# Patient Record
Sex: Male | Born: 1970 | Hispanic: Yes | Marital: Married | Smoking: Never smoker
Health system: Southern US, Community
[De-identification: ages and names within clinical notes are randomized; demographics above are authoritative.]

---

## 2016-07-07 ENCOUNTER — Ambulatory Visit: Payer: Self-pay | Admitting: Podiatry

## 2016-07-20 ENCOUNTER — Ambulatory Visit (INDEPENDENT_AMBULATORY_CARE_PROVIDER_SITE_OTHER): Payer: BLUE CROSS/BLUE SHIELD | Admitting: Orthopedic Surgery

## 2016-07-20 DIAGNOSIS — S93421A Sprain of deltoid ligament of right ankle, initial encounter: Secondary | ICD-10-CM | POA: Diagnosis not present

## 2016-07-20 DIAGNOSIS — S93411A Sprain of calcaneofibular ligament of right ankle, initial encounter: Secondary | ICD-10-CM

## 2016-08-10 ENCOUNTER — Encounter (INDEPENDENT_AMBULATORY_CARE_PROVIDER_SITE_OTHER): Payer: Self-pay | Admitting: Orthopedic Surgery

## 2016-08-10 ENCOUNTER — Ambulatory Visit (INDEPENDENT_AMBULATORY_CARE_PROVIDER_SITE_OTHER): Payer: BLUE CROSS/BLUE SHIELD | Admitting: Orthopedic Surgery

## 2016-08-10 VITALS — Ht 63.0 in | Wt 150.0 lb

## 2016-08-10 DIAGNOSIS — M7751 Other enthesopathy of right foot: Secondary | ICD-10-CM | POA: Diagnosis not present

## 2016-08-10 DIAGNOSIS — M25871 Other specified joint disorders, right ankle and foot: Secondary | ICD-10-CM | POA: Insufficient documentation

## 2016-08-10 NOTE — Progress Notes (Signed)
   Office Visit Note   Patient: Elijah Newton           Date of Birth: 1970-12-01           MRN: [id number] Visit Date: 08/10/2016              Requested by: Carolan ClinesGina M Snider, PA [address] [place], [place] [phone] PCP: Carolan ClinesSNIDER, GINA M, PA   Assessment & Plan: Visit Diagnoses: Impingement syndrome right ankle Plan: Plan for right ankle arthroscopy. Discussed the patient should obtain approximate 75% improvement in the right ankle symptoms. Discussed the risks and benefits including infection neurovascular injury persistent pain and need for additional surgery. Patient states he understands and wishes to proceed at this time.  Follow-Up Instructions: Return in about 2 weeks (around 08/24/2016).   Orders:  No orders of the defined types were placed in this encounter.  No orders of the defined types were placed in this encounter.     Procedures: No procedures performed   Clinical Data: No additional findings.   Subjective: Chief Complaint  Patient presents with  . Right Ankle - Follow-up    Impingement of right ankle s/p ankle sprain DOI roughly 6 months ago.    DOI roughly 6 months ago per pt report at last visit. S/pinjection of right ankle on 07/20/16 for ankle impingement. Pt states that the injection worked but pain is coming back over the past 5 days. He states that it does not hurt as badly as it did before. He is not taking anything for pain. In regular shoes wear and full wtb.    Review of Systems   Objective: Vital Signs: Ht 5\' 3"  (1.6 m)   Wt 150 lb (68 kg)   BMI 26.57 kg/m   Physical Exam on examination patient is alert oriented no adenopathy well-dressed normal affect normal respiratory effort he has a normal gait. Examination of right ankle there is no redness no cellulitis no swelling no signs of infection. Patient is tender to palpation anteriorly. Patient had good interval relief after the initial injection but this only lasted several  weeks. Ortho Exam trial  Specialty Comments:  No specialty comments available.  Imaging: No results found.   PMFS History: Patient Active Problem List   Diagnosis Date Noted  . Impingement syndrome of right ankle 08/10/2016   History reviewed. No pertinent past medical history.  History reviewed. No pertinent family history.  History reviewed. No pertinent surgical history. Social History   Occupational History  . Not on file.   Social History Main Topics  . Smoking status: Never Smoker  . Smokeless tobacco: Never Used  . Alcohol use No  . Drug use: No  . Sexual activity: Not on file

## 2016-09-01 DIAGNOSIS — M19171 Post-traumatic osteoarthritis, right ankle and foot: Secondary | ICD-10-CM | POA: Diagnosis not present

## 2016-09-14 ENCOUNTER — Encounter (INDEPENDENT_AMBULATORY_CARE_PROVIDER_SITE_OTHER): Payer: Self-pay | Admitting: Orthopedic Surgery

## 2016-09-14 ENCOUNTER — Ambulatory Visit (INDEPENDENT_AMBULATORY_CARE_PROVIDER_SITE_OTHER): Payer: BLUE CROSS/BLUE SHIELD | Admitting: Orthopedic Surgery

## 2016-09-14 DIAGNOSIS — M7751 Other enthesopathy of right foot: Secondary | ICD-10-CM

## 2016-09-14 DIAGNOSIS — M25871 Other specified joint disorders, right ankle and foot: Secondary | ICD-10-CM

## 2016-09-14 NOTE — Progress Notes (Signed)
   Office Visit Note   Patient: Elijah Newton           Date of Birth: 04/17/1971           MRN: [id number] Visit Date: 09/14/2016 Requested by: Carolan ClinesGina M Snider, PA [address] [place], [place] [phone] PCP: Carolan ClinesSNIDER, GINA M, PA  Subjective: Chief Complaint  Patient presents with  . Right Ankle - Pain    Patient is here in followup for right ankle pain.  He is doing his home exercises, and feels a little different kind of pain recently. He says he is doing well overall.  He is not taking any medications for pain.                Review of Systems  Constitutional: Negative for chills and fever.     Assessment & Plan: Visit Diagnoses:  1. Impingement syndrome of right ankle     Plan: Advance weightbearing as tolerated. No restrictions. Sutures were harvested today.  Follow-Up Instructions: Return in about 4 weeks (around 10/12/2016).   Orders:  No orders of the defined types were placed in this encounter.  No orders of the defined types were placed in this encounter.     Procedures: No procedures performed   Clinical Data: No additional findings.  Objective: Vital Signs: There were no vitals taken for this visit.  Physical Exam  Right Ankle Exam  Right ankle exam is normal. Swelling: mild  Tenderness  The patient is experiencing no tenderness.  Range of Motion  Inversion: normal  Eversion: normal  Other  Scars: present Pulse: present   Comments:  Sutures in place. Portals clean and dry.       Specialty Comments:  No specialty comments available.  Imaging: No results found.   PMFS History: Patient Active Problem List   Diagnosis Date Noted  . Impingement syndrome of right ankle 08/10/2016   No past medical history on file.  No family history on file.  No past surgical history on file. Social History   Occupational History  . Not on file.   Social History Main Topics  . Smoking status: Never Smoker  . Smokeless tobacco:  Never Used  . Alcohol use No  . Drug use: No  . Sexual activity: Not on file

## 2016-10-08 ENCOUNTER — Ambulatory Visit (INDEPENDENT_AMBULATORY_CARE_PROVIDER_SITE_OTHER): Payer: BLUE CROSS/BLUE SHIELD | Admitting: Orthopedic Surgery

## 2016-10-19 ENCOUNTER — Ambulatory Visit (INDEPENDENT_AMBULATORY_CARE_PROVIDER_SITE_OTHER): Payer: BLUE CROSS/BLUE SHIELD | Admitting: Orthopedic Surgery

## 2016-10-29 ENCOUNTER — Ambulatory Visit (INDEPENDENT_AMBULATORY_CARE_PROVIDER_SITE_OTHER): Payer: BLUE CROSS/BLUE SHIELD | Admitting: Orthopedic Surgery

## 2016-12-03 ENCOUNTER — Encounter (INDEPENDENT_AMBULATORY_CARE_PROVIDER_SITE_OTHER): Payer: Self-pay | Admitting: Orthopedic Surgery

## 2016-12-03 ENCOUNTER — Ambulatory Visit (INDEPENDENT_AMBULATORY_CARE_PROVIDER_SITE_OTHER): Payer: BLUE CROSS/BLUE SHIELD | Admitting: Orthopedic Surgery

## 2016-12-03 VITALS — Ht 63.0 in | Wt 150.0 lb

## 2016-12-03 DIAGNOSIS — M7751 Other enthesopathy of right foot: Secondary | ICD-10-CM

## 2016-12-03 DIAGNOSIS — M25871 Other specified joint disorders, right ankle and foot: Secondary | ICD-10-CM

## 2016-12-03 NOTE — Progress Notes (Signed)
   Office Visit Note   Patient: Elijah Newton           Date of Birth: [birth year]           MRN: [id number] Visit Date: 12/03/2016              Requested by: Elijah ClinesGina M Snider, Newton [address] [place], [place] [phone] PCP: Elijah ClinesSNIDER, GINA M, Newton  Chief Complaint  Patient presents with  . Right Ankle - Pain    HPI: Three week follow up for right ankle impingement s/p ankle scope the end of November. He states that he is having pain front of the ankle and complains of a lump at the incision portal. He states that the pain comes and goes and sometimes involves the lateral side of the ankle. He is in regular shoe wear. Elijah Newton, RMA  Patient states he does have some start up stiffness in the morning but overall he is about 90% better.  Assessment & Plan: Visit Diagnoses:  1. Impingement syndrome of right ankle     Plan: Recommended continue range of motion strengthening and scar massage.  Follow-Up Instructions: Return if symptoms worsen or fail to improve.   Ortho Exam Examination there is no redness no swelling he has pain-free crepitation free range of motion of the right ankle. He does have a little bit of scar tissue over the lateral portal.  Imaging: No results found.  Orders:  No orders of the defined types were placed in this encounter.  No orders of the defined types were placed in this encounter.    Procedures: No procedures performed  Clinical Data: No additional findings.  Subjective: Review of Systems  Objective: Vital Signs: Ht 5\' 3"  (1.6 m)   Wt 150 lb (68 kg)   BMI 26.57 kg/m   Specialty Comments:  No specialty comments available.  PMFS History: Patient Active Problem List   Diagnosis Date Noted  . Impingement syndrome of right ankle 08/10/2016   No past medical history on file.  No family history on file.  No past surgical history on file. Social History   Occupational History  . Not on file.   Social History Main  Topics  . Smoking status: Never Smoker  . Smokeless tobacco: Never Used  . Alcohol use No  . Drug use: No  . Sexual activity: Not on file

## 2017-11-22 ENCOUNTER — Ambulatory Visit (INDEPENDENT_AMBULATORY_CARE_PROVIDER_SITE_OTHER): Payer: BLUE CROSS/BLUE SHIELD | Admitting: Orthopedic Surgery

## 2017-12-01 ENCOUNTER — Encounter (INDEPENDENT_AMBULATORY_CARE_PROVIDER_SITE_OTHER): Payer: Self-pay | Admitting: Orthopedic Surgery

## 2017-12-01 ENCOUNTER — Ambulatory Visit (INDEPENDENT_AMBULATORY_CARE_PROVIDER_SITE_OTHER): Payer: BLUE CROSS/BLUE SHIELD | Admitting: Orthopedic Surgery

## 2017-12-01 DIAGNOSIS — M76821 Posterior tibial tendinitis, right leg: Secondary | ICD-10-CM | POA: Diagnosis not present

## 2017-12-01 NOTE — Progress Notes (Signed)
   Office Visit Note   Patient: Elijah Newton           Date of Birth: 11-05-1970           MRN: [id number] Visit Date: 02/20/2Charyl Bigger019              Requested by: Carolan ClinesSnider, Gina M, PA [address] [place], [place] [phone] PCP: Carolan ClinesSnider, Gina M, [place]  Chief Complaint  Patient presents with  . Right Ankle - Edema, Pain      HPI: Patient is a 47 year old gentleman who is status post right ankle arthroscopy for impingement in 2017.  Patient states he started having pain in the posterior medial aspect of his right ankle.  He complains of swelling and pain.  He states he does elevate his legs at night.  Assessment & Plan: Visit Diagnoses:  1. Posterior tibial tendinitis, right leg     Plan: We will place 2 felt relieving pads to better support his arch.  At follow-up in 4 weeks he will bring his sneakers and his over-the-counter orthotics we will see if we can modify those.  Follow-Up Instructions: Return in about 4 weeks (around 12/29/2017).   Ortho Exam  Patient is alert, oriented, no adenopathy, well-dressed, normal affect, normal respiratory effort. Examination patient has normal gait he has good pulses good ankle good subtalar motion.  The ankle joint is essentially nontender to palpation the sinus Tarsi is nontender to palpation.  With standing he has increasing pronation and valgus of the right foot compared to the left.  With single limb heel raise patient reproduces the pain on the right side  and has no heel varus with single limb heel raise.  He has good heel varus with single limb heel raise and is asymptomatic on the left side.  The posterior tibial tendon is tender to palpation and does have some swelling.  Imaging: No results found. No images are attached to the encounter.  Labs: No results found for: HGBA1C, ESRSEDRATE, CRP, LABURIC, REPTSTATUS, GRAMSTAIN, CULT, LABORGA  @LABSALLVALUES (HGBA1)@  There is no height or weight on file to calculate BMI.  Orders:    No orders of the defined types were placed in this encounter.  No orders of the defined types were placed in this encounter.    Procedures: No procedures performed  Clinical Data: No additional findings.  ROS:  All other systems negative, except as noted in the HPI. Review of Systems  Objective: Vital Signs: There were no vitals taken for this visit.  Specialty Comments:  No specialty comments available.  PMFS History: Patient Active Problem List   Diagnosis Date Noted  . Posterior tibial tendinitis, right leg 12/01/2017  . Impingement syndrome of right ankle 08/10/2016   History reviewed. No pertinent past medical history.  History reviewed. No pertinent family history.  History reviewed. No pertinent surgical history. Social History   Occupational History  . Not on file  Tobacco Use  . Smoking status: Never Smoker  . Smokeless tobacco: Never Used  Substance and Sexual Activity  . Alcohol use: No  . Drug use: No  . Sexual activity: Not on file

## 2017-12-03 ENCOUNTER — Telehealth (INDEPENDENT_AMBULATORY_CARE_PROVIDER_SITE_OTHER): Payer: Self-pay | Admitting: Orthopedic Surgery

## 2017-12-03 NOTE — Telephone Encounter (Signed)
Please advise 

## 2017-12-03 NOTE — Telephone Encounter (Signed)
Patient called to state that the pods(things inserted in shoe)made feet feel better. Wants to know if he needs a RX to get another pair.  Please call patient to advise

## 2017-12-06 NOTE — Telephone Encounter (Signed)
Called and sw pt to advise of message below will call with any other questions.

## 2017-12-06 NOTE — Telephone Encounter (Signed)
He should be able to obtain the scaphoid pads from [employer] at a medical supply store such as [facility] or [facility].

## 2017-12-29 ENCOUNTER — Ambulatory Visit (INDEPENDENT_AMBULATORY_CARE_PROVIDER_SITE_OTHER): Payer: BLUE CROSS/BLUE SHIELD | Admitting: Orthopedic Surgery

## 2017-12-29 ENCOUNTER — Encounter (INDEPENDENT_AMBULATORY_CARE_PROVIDER_SITE_OTHER): Payer: Self-pay | Admitting: Orthopedic Surgery

## 2017-12-29 VITALS — Ht 63.0 in | Wt 150.0 lb

## 2017-12-29 DIAGNOSIS — M76821 Posterior tibial tendinitis, right leg: Secondary | ICD-10-CM

## 2017-12-29 NOTE — Progress Notes (Signed)
   Office Visit Note   Patient: Elijah Newton           Date of Birth: 06/11/1971           MRN: [id number] Visit Date: 12/29/2017              Requested by: Carolan ClinesSnider, Gina M, PA [address] [place], [place] [phone] PCP: Carolan ClinesSnider, Gina M, [place]  Chief Complaint  Patient presents with  . 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  Patient states that he still has pain over the posterior tibial tendon despite using the felt pads.  He states he has increased pain in the morning with start up and states that he is concerned with the swelling and the discoloration around the ankle from the venous insufficiency.  Assessment & Plan: Visit Diagnoses:  1. Posterior tibial tendinitis, right leg     Plan: Recommended 15-20 mm compression knee-high stockings we will place him in a posterior tibial tendon brace.  Follow-Up Instructions: Return in about 4 weeks (around 01/26/2018).   Ortho Exam  Patient is alert, oriented, no adenopathy, well-dressed, normal affect, normal respiratory effort. Examination patient has good pulses he has good ankle good subtalar motion he has slight increased valgus of the hindfoot with pronation of the forefoot.  He is tender to palpation directly over the posterior tibial tendon as well as at the insertion.  The ankle joint is not tender to palpation.  He has a normal gait.  Imaging: No results found. No images are attached to the encounter.  Labs: No results found for: HGBA1C, ESRSEDRATE, CRP, LABURIC, REPTSTATUS, GRAMSTAIN, CULT, LABORGA  @LABSALLVALUES (HGBA1)@  Body mass index is 26.57 kg/m.  Orders:  No orders of the defined types were placed in this encounter.  No orders of the defined types were placed in this encounter.    Procedures: No procedures performed  Clinical Data: No additional findings.  ROS:  All other systems negative, except as  noted in the HPI. Review of Systems  Objective: Vital Signs: Ht 5\' 3"  (1.6 m)   Wt 150 lb (68 kg)   BMI 26.57 kg/m   Specialty Comments:  No specialty comments available.  PMFS History: Patient Active Problem List   Diagnosis Date Noted  . Posterior tibial tendinitis, right leg 12/01/2017  . Impingement syndrome of right ankle 08/10/2016   History reviewed. No pertinent past medical history.  History reviewed. No pertinent family history.  History reviewed. No pertinent surgical history. Social History   Occupational History  . Not on file  Tobacco Use  . Smoking status: Never Smoker  . Smokeless tobacco: Never Used  Substance and Sexual Activity  . Alcohol use: No  . Drug use: No  . Sexual activity: Not on file

## 2018-01-26 ENCOUNTER — Encounter (INDEPENDENT_AMBULATORY_CARE_PROVIDER_SITE_OTHER): Payer: Self-pay | Admitting: Orthopedic Surgery

## 2018-01-26 ENCOUNTER — Ambulatory Visit (INDEPENDENT_AMBULATORY_CARE_PROVIDER_SITE_OTHER): Payer: BLUE CROSS/BLUE SHIELD | Admitting: Orthopedic Surgery

## 2018-01-26 DIAGNOSIS — M6701 Short Achilles tendon (acquired), right ankle: Secondary | ICD-10-CM | POA: Diagnosis not present

## 2018-01-26 DIAGNOSIS — M76821 Posterior tibial tendinitis, right leg: Secondary | ICD-10-CM | POA: Diagnosis not present

## 2018-01-26 NOTE — Progress Notes (Signed)
   Office Visit Note   Patient: Elijah Newton           Date of Birth: 27-Jun-1971           MRN: [id number] Visit Date: 01/26/2018              Requested by: Carolan ClinesSnider, Gina M, PA [address] [place], [place] [phone] PCP: Carolan ClinesSnider, Gina M, [place]  No chief complaint on file.     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  Patient states he now has some numbness over the fifth metatarsal head.  He states the brace has helped a lot but has to wear the brace constantly to get relief.  Assessment & Plan: Visit Diagnoses:  1. Posterior tibial tendinitis, right leg   2. Achilles tendon contracture, right     Plan: Recommended Achilles stretching to unload pressure from the fifth metatarsal head.  Recommended a stiff soled orthotic such as a sole orthotic to see if this would provide sufficient relief without wearing the ASO brace.  Follow-Up Instructions: Return if symptoms worsen or fail to improve.   Ortho Exam  Patient is alert, oriented, no adenopathy, well-dressed, normal affect, normal respiratory effort. Examination patient has good pulses he has good ankle good subtalar motion he does have Achilles contracture with dorsiflexion only to neutral.  He is tender to palpation along the posterior tibial tendon beneath the medial malleolus there is no tenderness at the insertion.  He has good inversion strength he has numbness to palpation over the fifth metatarsal head.  He has dorsiflexion only to neutral with his knee extended.  Imaging: No results found. No images are attached to the encounter.  Labs: No results found for: HGBA1C, ESRSEDRATE, CRP, LABURIC, REPTSTATUS, GRAMSTAIN, CULT, LABORGA  @LABSALLVALUES (HGBA1)@  There is no height or weight on file to calculate BMI.  Orders:  No orders of the defined types were placed in this encounter.  No orders of the defined types were placed in this encounter.    Procedures: No procedures performed  Clinical Data: No additional findings.  ROS:  All other systems negative, except as noted in the HPI. Review of Systems  Objective: Vital Signs: There were no vitals taken for this visit.  Specialty Comments:  No specialty comments available.  PMFS History: Patient Active Problem List   Diagnosis Date Noted  . 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 Posterior tibial tendinitis, right leg 12/01/2017  . Impingement syndrome of right ankle 08/10/2016   History reviewed. No pertinent past medical history.  History reviewed. No pertinent family history.  History reviewed. No pertinent surgical history. Social History   Occupational History  . Not on file  Tobacco Use  . Smoking status: Never Smoker  . Smokeless tobacco: Never Used  Substance and Sexual Activity  . Alcohol use: No  . Drug use: No  . Sexual activity: Not on file

## 2021-01-07 ENCOUNTER — Ambulatory Visit (INDEPENDENT_AMBULATORY_CARE_PROVIDER_SITE_OTHER): Payer: BC Managed Care – PPO | Admitting: Orthopedic Surgery

## 2021-01-07 ENCOUNTER — Encounter: Payer: Self-pay | Admitting: Orthopedic Surgery

## 2021-01-07 ENCOUNTER — Ambulatory Visit: Payer: Self-pay

## 2021-01-07 DIAGNOSIS — M79671 Pain in right foot: Secondary | ICD-10-CM

## 2021-01-07 DIAGNOSIS — M5416 Radiculopathy, lumbar region: Secondary | ICD-10-CM | POA: Diagnosis not present

## 2021-01-07 DIAGNOSIS — M6701 Short Achilles tendon (acquired), right ankle: Secondary | ICD-10-CM

## 2021-01-07 MED ORDER — PREDNISONE 10 MG PO TABS: 10.0000 mg | ORAL_TABLET | Freq: Every day | ORAL | 0 refills | Status: DC

## 2021-01-07 NOTE — Progress Notes (Signed)
Office Visit Note   Patient: Elijah Newton           Date of Birth: [birth year]           MRN: [id number] Visit Date: 01/07/2021              Requested by: Carolan Clines, PA [address] [place],  [place] [address] PCP: Carolan Clines, [place]  Chief Complaint  Patient presents with  . Right Foot - Pain      HPI: Patient is a 50 year old gentleman who presents for 2 separate issues.  Patient was being treated for plantar fasciitis and posterior tibial tendinitis on the right he was wearing arch supports and he feels like this aggravated his back he now has back pain that radiates to the left buttocks and occasional radicular pain in the left posterior calf.  Patient complains of start up pain in the morning with plantar fascial pain on the right as well as start of pain after prolonged sitting.  Assessment & Plan: Visit Diagnoses:  1. Pain in right foot   2. Achilles tendon contracture, right   3. Radiculopathy, lumbar region     Plan: Patient will start with a low-dose prednisone 10 mg with breakfast continue with Achilles stretching on the right reevaluate in 4 weeks.  If patient's radicular symptoms still persist we may need to order an MRI scan of the lumbar spine.  Follow-Up Instructions: Return in about 4 weeks (around 02/04/2021).   Ortho Exam  Patient is alert, oriented, no adenopathy, well-dressed, normal affect, normal respiratory effort. Examination patient is point tender to palpation of the origin of the plantar fascia of the right he has dorsiflexion of the ankle about 20 degrees past neutral.  He has a negative straight leg raise on the right.  On the left patient has no pain with range of motion of the hip knee or ankle he has a positive straight leg raise on the left and no focal motor weakness in the left lower extremity.  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  Imaging: No results found. No images are attached to the encounter.  Labs: No results found for: HGBA1C, ESRSEDRATE, CRP, LABURIC, REPTSTATUS, GRAMSTAIN, CULT, LABORGA   No results found for: ALBUMIN, PREALBUMIN, CBC  No results found for: MG No results found for: VD25OH  No results found for: PREALBUMIN No flowsheet data found.   There is no height or weight on file to calculate BMI.  Orders:  Orders Placed This Encounter  Procedures  . XR Foot 2 Views Right   Meds ordered this encounter  Medications  . predniSONE (DELTASONE) 10 MG tablet    Sig: Take 1 tablet (10 mg total) by mouth daily with breakfast.    Dispense:  30 tablet    Refill:  0     Procedures: No procedures performed  Clinical Data: No additional findings.  ROS:  All other systems negative, except as noted in the HPI. Review of Systems  Objective: Vital Signs: There were no vitals taken for this visit.  Specialty Comments:  No specialty comments available.  PMFS History: Patient Active Problem List   Diagnosis Date Noted  . 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 Posterior tibial tendinitis, right leg 12/01/2017  . Impingement syndrome of right ankle 08/10/2016   History reviewed. No pertinent past medical history.  History reviewed. No pertinent family history.  History reviewed. No pertinent surgical history. Social History   Occupational History  . Not on file  Tobacco Use  . Smoking status: Never Smoker  . Smokeless tobacco: Never Used  Substance and Sexual Activity  . Alcohol use: No  . Drug use: No  . Sexual activity: Not on file

## 2021-02-05 ENCOUNTER — Encounter: Payer: Self-pay | Admitting: Physician Assistant

## 2021-02-05 ENCOUNTER — Ambulatory Visit (INDEPENDENT_AMBULATORY_CARE_PROVIDER_SITE_OTHER): Payer: BC Managed Care – PPO | Admitting: Physician Assistant

## 2021-02-05 DIAGNOSIS — M5416 Radiculopathy, lumbar region: Secondary | ICD-10-CM

## 2021-02-05 MED ORDER — PREDNISONE 10 MG PO TABS: 10.0000 mg | ORAL_TABLET | Freq: Every day | ORAL | 0 refills | Status: DC

## 2021-02-05 NOTE — Progress Notes (Signed)
Office Visit Note   Patient: Elijah Newton           Date of Birth: 12-16-1970           MRN: [id number] Visit Date: 02/05/2021              Requested by: Carolan Clines, PA [address] [place],  [place] [address] PCP: Carolan Clines, [place]  No chief complaint on file.     HPI: Patient is a pleasant 50 year old gentleman who follows up today for his lower back pain.  He was seen by Dr. Lajoyce Corners about a month ago for follow-up on his posterior tibial tendinitis.  He had been using arch supports that he felt were triggering some lower back and radicular symptoms.  Dr. Lajoyce Corners placed him on a trial of prednisone.  He said the prednisone has helped a little bit but in some ways he thinks some of his symptoms has progressed.  Originally he only had pain on the left side radiating down his leg now he is beginning to get similar symptoms on the right from the right buttock radiating down to the right knee.  He also feels like the left-sided pain is now wrapping around to his groin  Assessment & Plan: Visit Diagnoses: No diagnosis found.  Plan: Patient will have an MRI of his lumbar spine.  We can review this with him by phone if needed and with referral to Dr. Alvester Morin for epidural steroid injections  Follow-Up Instructions: No follow-ups on file.   Ortho Exam  Patient is alert, oriented, no adenopathy, well-dressed, normal affect, normal respiratory effort. He has a negative straight leg raise on the right a positive straight leg raise on the wet left.  He has pain radiating down the left buttock.  He does have some mild pain with internal and external rotation of the hip but this reproduces a burning pain per his description.  Bilaterally he has good resisted plantar flexion and dorsiflexion.  No focal motor weakness  Imaging: No results found. No images are attached to the encounter.  Labs: No results found for: HGBA1C, ESRSEDRATE, CRP, LABURIC, REPTSTATUS, GRAMSTAIN, CULT,  LABORGA   No results found for: ALBUMIN, PREALBUMIN, CBC  No results found for: MG No results found for: VD25OH  No results found for: PREALBUMIN No flowsheet data found.   There is no height or weight on file to calculate BMI.  Orders:  No orders of the defined types were placed in this encounter.  No orders of the defined types were placed in this encounter.    Procedures: No procedures performed  Clinical Data: No additional findings.  ROS:  All other systems negative, except as noted in the HPI. Review of Systems  Objective: Vital Signs: There were no vitals taken for this visit.  Specialty Comments:  No specialty comments available.  PMFS History: Patient Active Problem List   Diagnosis Date Noted  . 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 Posterior tibial tendinitis, right leg 12/01/2017  . Impingement syndrome of right ankle 08/10/2016   No past medical history on file.  No family history on file.  No past surgical history on file. Social History   Occupational History  . Not on file  Tobacco Use  . Smoking status: Never Smoker  . Smokeless tobacco: Never Used  Substance and Sexual Activity  . Alcohol use: No  . Drug use: No  . Sexual activity: Not on file

## 2021-02-18 ENCOUNTER — Ambulatory Visit
Admission: RE | Admit: 2021-02-18 | Discharge: 2021-02-18 | Disposition: A | Discharge status: Home / Self Care | Payer: BC Managed Care – PPO | Source: Ambulatory Visit | Attending: Physician Assistant | Admitting: Physician Assistant

## 2021-02-18 ENCOUNTER — Other Ambulatory Visit: Payer: Self-pay

## 2021-02-18 DIAGNOSIS — M5416 Radiculopathy, lumbar region: Secondary | ICD-10-CM

## 2021-02-20 ENCOUNTER — Encounter: Payer: Self-pay | Admitting: Orthopedic Surgery

## 2021-02-20 ENCOUNTER — Ambulatory Visit (INDEPENDENT_AMBULATORY_CARE_PROVIDER_SITE_OTHER): Payer: BC Managed Care – PPO | Admitting: Orthopedic Surgery

## 2021-02-20 DIAGNOSIS — M5416 Radiculopathy, lumbar region: Secondary | ICD-10-CM | POA: Diagnosis not present

## 2021-02-20 NOTE — Progress Notes (Signed)
   Office Visit Note   Patient: Elijah Newton           Date of Birth: 12-07-1970           MRN: [id number] Visit Date: 02/20/2021              Requested by: Carolan Clines, PA [address] [place],  [place] [address] PCP: Carolan Clines, [place]  Chief Complaint  Patient presents with  . Lower Back - Follow-up    MRI review       HPI: Patient is a 50 year old gentleman who still has persistent left-sided radicular symptoms that radiates from the buttock anteriorly to the thigh and down his leg.  Patient has pain with sitting pain with prolonged standing states he has to continue moving.  Assessment & Plan: Visit Diagnoses:  1. Radiculopathy, lumbar region     Plan: We will request epidural steroid injections with Dr. Alvester Morin.  Follow-Up Instructions: Return if symptoms worsen or fail to improve, for Follow-up with Dr. Alvester Morin.   Ortho Exam  Patient is alert, oriented, no adenopathy, well-dressed, normal affect, normal respiratory effort. Examination patient has a normal gait he does have a positive straight leg raise on the left.  He has no focal motor weakness.  Review of the MRI scan shows disc bulge at the L4-5 level affecting the L5 nerve root on the left.  There is also some bulging at L3-4 with mild left foraminal narrowing.  All bulging disks on the left side consistent with his symptoms.  Imaging: No results found. No images are attached to the encounter.  Labs: No results found for: HGBA1C, ESRSEDRATE, CRP, LABURIC, REPTSTATUS, GRAMSTAIN, CULT, LABORGA   No results found for: ALBUMIN, PREALBUMIN, CBC  No results found for: MG No results found for: VD25OH  No results found for: PREALBUMIN No flowsheet data found.   There is no height or weight on file to calculate BMI.  Orders:  No orders of the defined types were placed in this encounter.  No orders of the defined types were placed in this encounter.    Procedures: No procedures  performed  Clinical Data: No additional findings.  ROS:  All other systems negative, except as noted in the HPI. Review of Systems  Objective: Vital Signs: There were no vitals taken for this visit.  Specialty Comments:  No specialty comments available.  PMFS History: Patient Active Problem List   Diagnosis Date Noted  . 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 Posterior tibial tendinitis, right leg 12/01/2017  . Impingement syndrome of right ankle 08/10/2016   History reviewed. No pertinent past medical history.  History reviewed. No pertinent family history.  History reviewed. No pertinent surgical history. Social History   Occupational History  . Not on file  Tobacco Use  . Smoking status: Never Smoker  . Smokeless tobacco: Never Used  Substance and Sexual Activity  . Alcohol use: No  . Drug use: No  . Sexual activity: Not on file

## 2021-03-05 ENCOUNTER — Telehealth: Payer: Self-pay

## 2021-03-05 ENCOUNTER — Other Ambulatory Visit: Payer: Self-pay

## 2021-03-05 DIAGNOSIS — M5416 Radiculopathy, lumbar region: Secondary | ICD-10-CM

## 2021-03-05 MED ORDER — PREDNISONE 10 MG PO TABS: 10.0000 mg | ORAL_TABLET | Freq: Every day | ORAL | 0 refills | Status: DC

## 2021-03-05 NOTE — Telephone Encounter (Signed)
I called and sw pt to advise ok per PA to refill prednisone and this was sent to pharm. Per Dr. Audrie Lia last note see that referral to [facility] for ESI was dictated but no order in chart this was entered today. I called pt to advise of all of this and voiced understanding will call with any questions.

## 2021-03-05 NOTE — Telephone Encounter (Signed)
Patient called he is requesting a rx refill for prednisone , patient is also requesting a referral to be sent to Dr.Newton for a injection in his back which was discussed in the last visit call [phone]

## 2021-03-14 ENCOUNTER — Telehealth: Payer: Self-pay | Admitting: Orthopedic Surgery

## 2021-03-14 NOTE — Telephone Encounter (Signed)
Pt called today wanting to get scheduled to be seen for injections. Chart says they have been approved and authorized in referrals with Dr. Alvester Morin on 03/11/21. The best call back number is [phone].

## 2021-03-14 NOTE — Telephone Encounter (Signed)
Scheduled

## 2021-03-14 NOTE — Telephone Encounter (Signed)
Left L5 TF- Lajoyce Corners

## 2021-03-17 ENCOUNTER — Telehealth: Payer: Self-pay | Admitting: Physical Medicine and Rehabilitation

## 2021-03-17 NOTE — Telephone Encounter (Signed)
Patient called asked if he can get the latest appointment he can? Patient said the appointment need to be latest appointment of the day. The number to contact patient is  [phone]

## 2021-03-17 NOTE — Telephone Encounter (Signed)
Patient will keep current appointment.

## 2021-03-25 ENCOUNTER — Ambulatory Visit (INDEPENDENT_AMBULATORY_CARE_PROVIDER_SITE_OTHER): Payer: BC Managed Care – PPO | Admitting: Physical Medicine and Rehabilitation

## 2021-03-25 ENCOUNTER — Encounter: Payer: Self-pay | Admitting: Physical Medicine and Rehabilitation

## 2021-03-25 ENCOUNTER — Ambulatory Visit: Payer: Self-pay

## 2021-03-25 ENCOUNTER — Other Ambulatory Visit: Payer: Self-pay

## 2021-03-25 VITALS — BP 107/76 | HR 87

## 2021-03-25 DIAGNOSIS — M5416 Radiculopathy, lumbar region: Secondary | ICD-10-CM | POA: Diagnosis not present

## 2021-03-25 MED ORDER — METHYLPREDNISOLONE ACETATE 80 MG/ML IJ SUSP
80.0000 mg | Freq: Once | INTRAMUSCULAR | Status: AC
  Administered 2021-03-25: 80 mg

## 2021-03-25 NOTE — Progress Notes (Signed)
Pt state lower back pain that travels to his left hip. Pt state walking, standing and laying down makes the pain worse. Pt state he takes pain meds to help ease his pain.  Numeric Pain Rating Scale and Functional Assessment Average Pain 6   In the last MONTH (on 0-10 scale) has pain interfered with the following?  1. General activity like being  able to carry out your everyday physical activities such as walking, climbing stairs, carrying groceries, or moving a chair?  Rating(9)   +Driver, -BT, -Dye Allergies.

## 2021-03-25 NOTE — Patient Instructions (Signed)

## 2021-03-27 NOTE — Procedures (Signed)
Lumbosacral Transforaminal Epidural Steroid Injection - Sub-Pedicular Approach with Fluoroscopic Guidance  Patient: Elijah Newton      Date of Birth: 01-23-1971 MRN: [id number] PCP: Carolan Clines, PA      Visit Date: 03/25/2021   Universal Protocol:    Date/Time: 03/25/2021  Consent Given By: the patient  Position: PRONE  Additional Comments: Vital signs were monitored before and after the procedure. Patient was prepped and draped in the usual sterile fashion. The correct patient, procedure, and site was verified.   Injection Procedure Details:   Procedure diagnoses: Lumbar radiculopathy [M54.16]    Meds Administered:  Meds ordered this encounter  Medications   methylPREDNISolone acetate (DEPO-MEDROL) injection 80 mg    Laterality: Left  Location/Site:  L5-S1  Needle:5.0 in., 22 ga.  Short bevel or Quincke spinal needle  Needle Placement: Transforaminal  Findings:    -Comments: Excellent flow of contrast along the nerve, nerve root and into the epidural space.  Procedure Details: After squaring off the end-plates to get a true AP view, the C-arm was positioned so that an oblique view of the foramen as noted above was visualized. The target area is just inferior to the "nose of the scotty dog" or sub pedicular. The soft tissues overlying this structure were infiltrated with 2-3 ml. of 1% Lidocaine without Epinephrine.  The spinal needle was inserted toward the target using a "trajectory" view along the fluoroscope beam.  Under AP and lateral visualization, the needle was advanced so it did not puncture dura and was located close the 6 O'Clock position of the pedical in AP tracterory. Biplanar projections were used to confirm position. Aspiration was confirmed to be negative for CSF and/or blood. A 1-2 ml. volume of Isovue-250 was injected and flow of contrast was noted at each level. Radiographs were obtained for documentation purposes.   After attaining the desired  flow of contrast documented above, a 0.5 to 1.0 ml test dose of 0.25% Marcaine was injected into each respective transforaminal space.  The patient was observed for 90 seconds post injection.  After no sensory deficits were reported, and normal lower extremity motor function was noted,   the above injectate was administered so that equal amounts of the injectate were placed at each foramen (level) into the transforaminal epidural space.   Additional Comments:  The patient tolerated the procedure well Dressing: 2 x 2 sterile gauze and Band-Aid    Post-procedure details: Patient was observed during the procedure. Post-procedure instructions were reviewed.  Patient left the clinic in stable condition.

## 2021-03-27 NOTE — Progress Notes (Signed)
Elijah Newton - 50 y.o. male MRN [id number]  Date of birth: 1971-02-11  Office Visit Note: Visit Date: 03/25/2021 PCP: Carolan Clines, PA Referred by: Carolan Clines, PA  Subjective: Chief Complaint  Patient presents with   Lower Back - Pain   Left Hip - Pain   HPI:  Elijah Newton is a 50 y.o. male who comes in today at the request of Dr. Aldean Baker for planned Left L5-S1 Lumbar Transforaminal epidural steroid injection with fluoroscopic guidance.  The patient has failed conservative care including home exercise, medications, time and activity modification.  This injection will be diagnostic and hopefully therapeutic.  Please see requesting physician notes for further details and justification. MRI reviewed with images and spine model.  MRI reviewed in the note below.  I did review the MRI with the patient using spine models and imaging.  He does have broad disc bulging with some foraminal involvement bilaterally but no high-grade stenosis of the foramen.  Left L5 nerve root could be impacted or irritated.  We described this at length the natural history of disc protrusions and radiculitis.  Hopefully he will do well with the injection.     ROS Otherwise per HPI.  Assessment & Plan: Visit Diagnoses:    ICD-10-CM   1. Lumbar radiculopathy  M54.16 XR C-ARM NO REPORT    Epidural Steroid injection    methylPREDNISolone acetate (DEPO-MEDROL) injection 80 mg      Plan: No additional findings.   Meds & Orders:  Meds ordered this encounter  Medications   methylPREDNISolone acetate (DEPO-MEDROL) injection 80 mg    Orders Placed This Encounter  Procedures   XR C-ARM NO REPORT   Epidural Steroid injection    Follow-up: No follow-ups on file.   Procedures: No procedures performed  Lumbosacral Transforaminal Epidural Steroid Injection - Sub-Pedicular Approach with Fluoroscopic Guidance  Patient: Elijah Newton      Date of Birth: 1971/08/17 MRN: [id number] PCP: Carolan Clines, PA      Visit Date: 03/25/2021   Universal Protocol:    Date/Time: 03/25/2021  Consent Given By: the patient  Position: PRONE  Additional Comments: Vital signs were monitored before and after the procedure. Patient was prepped and draped in the usual sterile fashion. The correct patient, procedure, and site was verified.   Injection Procedure Details:   Procedure diagnoses: Lumbar radiculopathy [M54.16]    Meds Administered:  Meds ordered this encounter  Medications   methylPREDNISolone acetate (DEPO-MEDROL) injection 80 mg    Laterality: Left  Location/Site:  L5-S1  Needle:5.0 in., 22 ga.  Short bevel or Quincke spinal needle  Needle Placement: Transforaminal  Findings:    -Comments: Excellent flow of contrast along the nerve, nerve root and into the epidural space.  Procedure Details: After squaring off the end-plates to get a true AP view, the C-arm was positioned so that an oblique view of the foramen as noted above was visualized. The target area is just inferior to the "nose of the scotty dog" or sub pedicular. The soft tissues overlying this structure were infiltrated with 2-3 ml. of 1% Lidocaine without Epinephrine.  The spinal needle was inserted toward the target using a "trajectory" view along the fluoroscope beam.  Under AP and lateral visualization, the needle was advanced so it did not puncture dura and was located close the 6 O'Clock position of the pedical in AP tracterory. Biplanar projections were used to confirm position. Aspiration was confirmed to be negative for CSF and/or  blood. A 1-2 ml. volume of Isovue-250 was injected and flow of contrast was noted at each level. Radiographs were obtained for documentation purposes.   After attaining the desired flow of contrast documented above, a 0.5 to 1.0 ml test dose of 0.25% Marcaine was injected into each respective transforaminal space.  The patient was observed for 90 seconds post injection.  After no  sensory deficits were reported, and normal lower extremity motor function was noted,   the above injectate was administered so that equal amounts of the injectate were placed at each foramen (level) into the transforaminal epidural space.   Additional Comments:  The patient tolerated the procedure well Dressing: 2 x 2 sterile gauze and Band-Aid    Post-procedure details: Patient was observed during the procedure. Post-procedure instructions were reviewed.  Patient left the clinic in stable condition.    Clinical History: MRI LUMBAR SPINE WITHOUT CONTRAST   TECHNIQUE: Multiplanar, multisequence MR imaging of the lumbar spine was performed. No intravenous contrast was administered.   COMPARISON:  None.   FINDINGS: Segmentation:  Standard.   Alignment: Straightening of lordosis. Congenital spinal canal narrowing.   Vertebrae: Normal bone marrow signal intensity. No fracture or aggressive osseous lesion.   Conus medullaris and cauda equina: Conus extends to the L1 level. Conus and cauda equina appear normal.   Disc levels: Mild multilevel desiccation.   L1-2: No significant disc bulge. Patent spinal canal and neural foramen.   L2-3: Minimal disc bulge.  Patent spinal canal and neural foramen.   L3-4: Minimal disc bulge with superimposed left foraminal protrusion. Facet degenerative spurring. Patent spinal canal and right neural foramen. Minimal to mild left neural foraminal narrowing.   L4-5: Disc bulge with superimposed bilateral foraminal protrusions. Abutment of the descending left L5 nerve root. Bilateral facet degenerative spurring. Patent spinal canal. Mild bilateral neural foraminal narrowing.   L5-S1: Minimal disc bulge and bilateral facet degenerative spurring. Patent spinal canal and neural foramen.   Paraspinal and other soft tissues: Negative.   IMPRESSION: Disc bulge with superimposed bilateral foraminal protrusions at the L4-5 level. There is  abutment of the descending left L5 nerve root with mild bilateral neural foraminal narrowing at this level.   Left L3-4 foraminal protrusion with mild left neural foraminal narrowing.   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     Electronically Signed   By: Stana Bunting M.D.   On: 02/19/2021 10:25     Objective:  VS:  HT:    WT:   BMI:     BP:107/76  HR:87bpm  TEMP: ( )  RESP:  Physical Exam Vitals and nursing note reviewed.  Constitutional:      General: He is not in acute distress.    Appearance: Normal appearance. He is not ill-appearing.  HENT:     Head: Normocephalic and atraumatic.     Right Ear: External ear normal.     Left Ear: External ear normal.     Nose: No congestion.  Eyes:     Extraocular Movements: Extraocular movements intact.  Cardiovascular:     Rate and Rhythm: Normal rate.     Pulses: Normal pulses.  Pulmonary:     Effort: Pulmonary effort is normal. No respiratory distress.  Abdominal:     General: There is no distension.     Palpations: Abdomen is soft.  Musculoskeletal:        General: No tenderness or signs of injury.     Cervical back: Neck supple.  Right lower leg: No edema.     Left lower leg: No edema.     Comments: Patient has good distal strength without clonus.  Positive slump test on the left.  Skin:    Findings: No erythema or rash.  Neurological:     General: No focal deficit present.     Mental Status: He is alert and oriented to person, place, and time.     Sensory: No sensory deficit.     Motor: No weakness or abnormal muscle tone.     Coordination: Coordination normal.  Psychiatric:        Mood and Affect: Mood normal.        Behavior: Behavior normal.     Imaging: No results found.

## 2021-05-01 ENCOUNTER — Ambulatory Visit (INDEPENDENT_AMBULATORY_CARE_PROVIDER_SITE_OTHER): Payer: BC Managed Care – PPO | Admitting: Physician Assistant

## 2021-05-01 DIAGNOSIS — M5416 Radiculopathy, lumbar region: Secondary | ICD-10-CM

## 2021-05-01 NOTE — Progress Notes (Signed)
   Office Visit Note   Patient: Elijah Newton           Date of Birth: Jul 21, 1971           MRN: [id number] Visit Date: 05/01/2021              Requested by: Carolan Clines, PA [address] [place],  [place] [address] PCP: Carolan Clines, [place]  Chief Complaint  Patient presents with   Lower Back - Pain      HPI: Patient presents today to follow-up on his lower back pain that radiates to his left and right hip left greater than right he did have 1 epidural steroid injection with Dr. Alvester Morin a few weeks ago and was told to follow-up if needed.  He said he thought he got a little relief but his back felt "odd ".  He has now had return of his symptoms  Assessment & Plan: Visit Diagnoses: No diagnosis found.  Plan: Recommended either a course of physical therapy or follow-up with Dr. Alvester Morin as was suggested.  Patient would like to follow-up with Dr. Alvester Morin  Follow-Up Instructions: No follow-ups on file.   Ortho Exam  Patient is alert, oriented, no adenopathy, well-dressed, normal affect, normal respiratory effort. No tenderness in the lower spine.  He does have some tenderness to the bilateral hips.  No tenderness with back extension flexion or side to side bending.  He has a mildly straight leg raise that is positive on the left negative on the right plantarflexion strength is 5 out of 5  Imaging: No results found. No images are attached to the encounter.  Labs: No results found for: HGBA1C, ESRSEDRATE, CRP, LABURIC, REPTSTATUS, GRAMSTAIN, CULT, LABORGA   No results found for: ALBUMIN, PREALBUMIN, CBC  No results found for: MG No results found for: VD25OH  No results found for: PREALBUMIN No flowsheet data found.   There is no height or weight on file to calculate BMI.  Orders:  No orders of the defined types were placed in this encounter.  No orders of the defined types were placed in this encounter.    Procedures: No procedures performed  Clinical  Data: No additional findings.  ROS:  All other systems negative, except as noted in the HPI. Review of Systems  Objective: Vital Signs: There were no vitals taken for this visit.  Specialty Comments:  No specialty comments available.  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  No family history on file.  No past surgical history on file. Social History   Occupational History   Not on file  Tobacco Use   Smoking status: Never   Smokeless tobacco: Never  Substance and Sexual Activity   Alcohol use: No   Drug use: No   Sexual activity: Not on file

## 2021-05-07 ENCOUNTER — Telehealth: Payer: Self-pay | Admitting: Physical Medicine and Rehabilitation

## 2021-05-07 ENCOUNTER — Telehealth: Payer: Self-pay | Admitting: Orthopedic Surgery

## 2021-05-07 DIAGNOSIS — M5416 Radiculopathy, lumbar region: Secondary | ICD-10-CM

## 2021-05-07 MED ORDER — PREDNISONE 10 MG PO TABS: 50.0000 mg | ORAL_TABLET | Freq: Every day | ORAL | 0 refills | Status: DC

## 2021-05-07 NOTE — Telephone Encounter (Signed)
Please see below and advise.

## 2021-05-07 NOTE — Telephone Encounter (Signed)
Pt called wondering if he could get something for his back pain. He states he is in a lot of pain and has been waiting for an appt with Dr.Newton and no one has called him. I just put in a note for someone to call him.   CB [phone]

## 2021-05-07 NOTE — Telephone Encounter (Signed)
Pt called and would like to get a back injection with Dr.Newton. Left L5 TF  CB [phone]

## 2021-05-07 NOTE — Telephone Encounter (Signed)
I called and advised pt of the rx and that someone will call to get scheduled with Dr. Alvester Morin

## 2021-05-07 NOTE — Addendum Note (Signed)
Addended by: Barnie Del R on: 05/07/2021 01:27 PM   Modules accepted: Orders

## 2021-05-27 ENCOUNTER — Ambulatory Visit (INDEPENDENT_AMBULATORY_CARE_PROVIDER_SITE_OTHER): Payer: BC Managed Care – PPO | Admitting: Physical Medicine and Rehabilitation

## 2021-05-27 ENCOUNTER — Ambulatory Visit: Payer: Self-pay

## 2021-05-27 ENCOUNTER — Other Ambulatory Visit: Payer: Self-pay

## 2021-05-27 ENCOUNTER — Encounter: Payer: Self-pay | Admitting: Physical Medicine and Rehabilitation

## 2021-05-27 VITALS — BP 112/75 | HR 60

## 2021-05-27 DIAGNOSIS — M5416 Radiculopathy, lumbar region: Secondary | ICD-10-CM | POA: Diagnosis not present

## 2021-05-27 MED ORDER — METHYLPREDNISOLONE ACETATE 80 MG/ML IJ SUSP
80.0000 mg | Freq: Once | INTRAMUSCULAR | Status: AC
  Administered 2021-05-27: 80 mg

## 2021-05-27 NOTE — Progress Notes (Signed)
Elijah Newton - 50 y.o. male MRN [id number]  Date of birth: 1970-11-27  Office Visit Note: Visit Date: 05/27/2021 PCP: Carolan Clines, PA Referred by: Carolan Clines, PA  Subjective: Chief Complaint  Patient presents with   Lower Back - Pain   HPI:  Elijah Newton is a 50 y.o. male who comes in today at the request of Dr. Aldean Baker for planned Left L5-S1 Lumbar Transforaminal epidural steroid injection with fluoroscopic guidance.  The patient has failed conservative care including home exercise, medications, time and activity modification.  This injection will be diagnostic and hopefully therapeutic.  Please see requesting physician notes for further details and justification. MRI reviewed with images and spine model.  MRI reviewed in the note below.     ROS Otherwise per HPI.  Assessment & Plan: Visit Diagnoses:    ICD-10-CM   1. Lumbar radiculopathy  M54.16 XR C-ARM NO REPORT    Epidural Steroid injection    methylPREDNISolone acetate (DEPO-MEDROL) injection 80 mg      Plan: No additional findings.   Meds & Orders:  Meds ordered this encounter  Medications   methylPREDNISolone acetate (DEPO-MEDROL) injection 80 mg    Orders Placed This Encounter  Procedures   XR C-ARM NO REPORT   Epidural Steroid injection    Follow-up: Return if symptoms worsen or fail to improve.   Procedures: No procedures performed  Lumbosacral Transforaminal Epidural Steroid Injection - Sub-Pedicular Approach with Fluoroscopic Guidance  Patient: Elijah Newton      Date of Birth: 1970-12-09 MRN: [id number] PCP: Carolan Clines, PA      Visit Date: 05/27/2021   Universal Protocol:    Date/Time: 05/27/2021  Consent Given By: the patient  Position: PRONE  Additional Comments: Vital signs were monitored before and after the procedure. Patient was prepped and draped in the usual sterile fashion. The correct patient, procedure, and site was verified.   Injection Procedure Details:    Procedure diagnoses: Lumbar radiculopathy [M54.16]    Meds Administered:  Meds ordered this encounter  Medications   methylPREDNISolone acetate (DEPO-MEDROL) injection 80 mg    Laterality: Left  Location/Site:  L5-S1  Needle:5.0 in., 22 ga.  Short bevel or Quincke spinal needle  Needle Placement: Transforaminal  Findings:    -Comments: Excellent flow of contrast along the nerve, nerve root and into the epidural space.  Procedure Details: After squaring off the end-plates to get a true AP view, the C-arm was positioned so that an oblique view of the foramen as noted above was visualized. The target area is just inferior to the "nose of the scotty dog" or sub pedicular. The soft tissues overlying this structure were infiltrated with 2-3 ml. of 1% Lidocaine without Epinephrine.  The spinal needle was inserted toward the target using a "trajectory" view along the fluoroscope beam.  Under AP and lateral visualization, the needle was advanced so it did not puncture dura and was located close the 6 O'Clock position of the pedical in AP tracterory. Biplanar projections were used to confirm position. Aspiration was confirmed to be negative for CSF and/or blood. A 1-2 ml. volume of Isovue-250 was injected and flow of contrast was noted at each level. Radiographs were obtained for documentation purposes.   After attaining the desired flow of contrast documented above, a 0.5 to 1.0 ml test dose of 0.25% Marcaine was injected into each respective transforaminal space.  The patient was observed for 90 seconds post injection.  After no sensory deficits were  reported, and normal lower extremity motor function was noted,   the above injectate was administered so that equal amounts of the injectate were placed at each foramen (level) into the transforaminal epidural space.   Additional Comments:  The patient tolerated the procedure well Dressing: 2 x 2 sterile gauze and Band-Aid    Post-procedure  details: Patient was observed during the procedure. Post-procedure instructions were reviewed.  Patient left the clinic in stable condition.    Clinical History: MRI LUMBAR SPINE WITHOUT CONTRAST   TECHNIQUE: Multiplanar, multisequence MR imaging of the lumbar spine was performed. No intravenous contrast was administered.   COMPARISON:  None.   FINDINGS: Segmentation:  Standard.   Alignment: Straightening of lordosis. Congenital spinal canal narrowing.   Vertebrae: Normal bone marrow signal intensity. No fracture or aggressive osseous lesion.   Conus medullaris and cauda equina: Conus extends to the L1 level. Conus and cauda equina appear normal.   Disc levels: Mild multilevel desiccation.   L1-2: No significant disc bulge. Patent spinal canal and neural foramen.   L2-3: Minimal disc bulge.  Patent spinal canal and neural foramen.   L3-4: Minimal disc bulge with superimposed left foraminal protrusion. Facet degenerative spurring. Patent spinal canal and right neural foramen. Minimal to mild left neural foraminal narrowing.   L4-5: Disc bulge with superimposed bilateral foraminal protrusions. Abutment of the descending left L5 nerve root. Bilateral facet degenerative spurring. Patent spinal canal. Mild bilateral neural foraminal narrowing.   L5-S1: Minimal disc bulge and bilateral facet degenerative spurring. Patent spinal canal and neural foramen.   Paraspinal and other soft tissues: Negative.   IMPRESSION: Disc bulge with superimposed bilateral foraminal protrusions at the L4-5 level. There is abutment of the descending left L5 nerve root with mild bilateral neural foraminal narrowing at this level.   Left L3-4 foraminal protrusion with mild left neural foraminal narrowing.   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     Electronically Signed   By: Stana Bunting M.D.   On: 02/19/2021 10:25     Objective:  VS:  HT:     WT:   BMI:     BP:112/75  HR:60bpm  TEMP: ( )  RESP:  Physical Exam   Imaging: No results found.

## 2021-05-27 NOTE — Progress Notes (Signed)
Left buttock pain. Small amount of relief from last injection.  Numeric Pain Rating Scale and Functional Assessment Average Pain 7   In the last MONTH (on 0-10 scale) has pain interfered with the following?  1. General activity like being  able to carry out your everyday physical activities such as walking, climbing stairs, carrying groceries, or moving a chair?  Rating(8)   +Driver, -BT, -Dye Allergies.

## 2021-05-27 NOTE — Patient Instructions (Signed)

## 2021-06-13 NOTE — Procedures (Signed)
Lumbosacral Transforaminal Epidural Steroid Injection - Sub-Pedicular Approach with Fluoroscopic Guidance  Patient: Elijah Newton      Date of Birth: Oct 01, 1971 MRN: [id number] PCP: Carolan Clines, PA      Visit Date: 05/27/2021   Universal Protocol:    Date/Time: 05/27/2021  Consent Given By: the patient  Position: PRONE  Additional Comments: Vital signs were monitored before and after the procedure. Patient was prepped and draped in the usual sterile fashion. The correct patient, procedure, and site was verified.   Injection Procedure Details:   Procedure diagnoses: Lumbar radiculopathy [M54.16]    Meds Administered:  Meds ordered this encounter  Medications   methylPREDNISolone acetate (DEPO-MEDROL) injection 80 mg    Laterality: Left  Location/Site:  L5-S1  Needle:5.0 in., 22 ga.  Short bevel or Quincke spinal needle  Needle Placement: Transforaminal  Findings:    -Comments: Excellent flow of contrast along the nerve, nerve root and into the epidural space.  Procedure Details: After squaring off the end-plates to get a true AP view, the C-arm was positioned so that an oblique view of the foramen as noted above was visualized. The target area is just inferior to the "nose of the scotty dog" or sub pedicular. The soft tissues overlying this structure were infiltrated with 2-3 ml. of 1% Lidocaine without Epinephrine.  The spinal needle was inserted toward the target using a "trajectory" view along the fluoroscope beam.  Under AP and lateral visualization, the needle was advanced so it did not puncture dura and was located close the 6 O'Clock position of the pedical in AP tracterory. Biplanar projections were used to confirm position. Aspiration was confirmed to be negative for CSF and/or blood. A 1-2 ml. volume of Isovue-250 was injected and flow of contrast was noted at each level. Radiographs were obtained for documentation purposes.   After attaining the desired  flow of contrast documented above, a 0.5 to 1.0 ml test dose of 0.25% Marcaine was injected into each respective transforaminal space.  The patient was observed for 90 seconds post injection.  After no sensory deficits were reported, and normal lower extremity motor function was noted,   the above injectate was administered so that equal amounts of the injectate were placed at each foramen (level) into the transforaminal epidural space.   Additional Comments:  The patient tolerated the procedure well Dressing: 2 x 2 sterile gauze and Band-Aid    Post-procedure details: Patient was observed during the procedure. Post-procedure instructions were reviewed.  Patient left the clinic in stable condition.

## 2021-06-17 ENCOUNTER — Other Ambulatory Visit: Payer: Self-pay | Admitting: Family

## 2021-07-21 ENCOUNTER — Other Ambulatory Visit: Payer: Self-pay | Admitting: Family

## 2022-03-15 IMAGING — MR MR LUMBAR SPINE W/O CM
4 of 5 series · 18 of 48 positions shown · non-contrast
Comparison: None.

CLINICAL DATA: Lumbar radiculopathy, > 6 wks

EXAM:
MRI LUMBAR SPINE WITHOUT CONTRAST
TECHNIQUE: Multiplanar, multisequence MR imaging of the lumbar spine was
performed. No intravenous contrast was administered.

[Series 5: T2 · sagittal · 4.0mm · 0.73mm/px · 6 of 15 slices shown (1 of 2)]
[im 1/15]
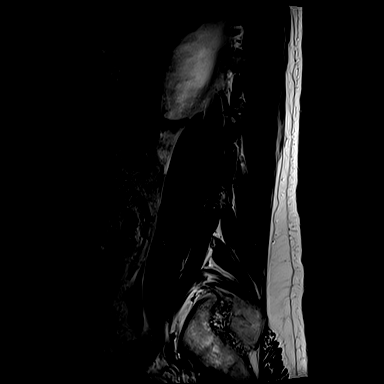
[im 3/15]
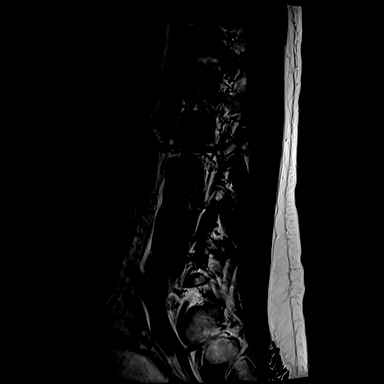
[im 6/15]
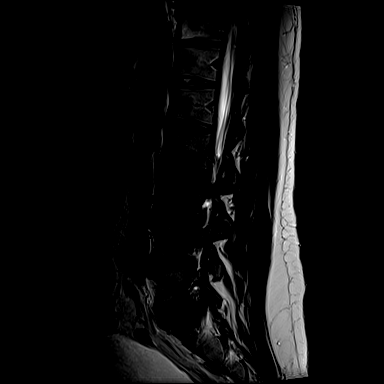
[im 9/15]
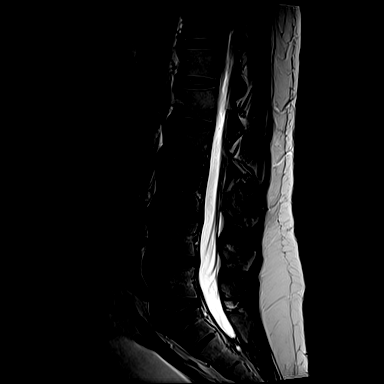
[im 12/15]
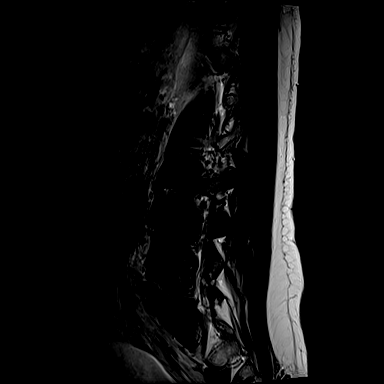
[im 15/15]
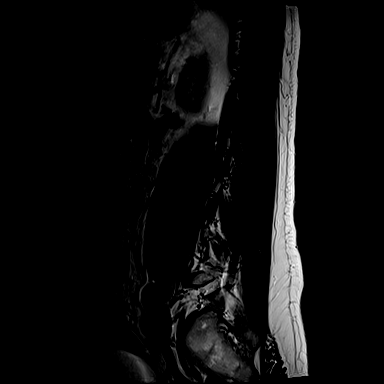

[Series 6: T1 · sagittal · 4.0mm · 0.73mm/px · 3 of 15 slices shown (1 of 2)]
[im 3/15]
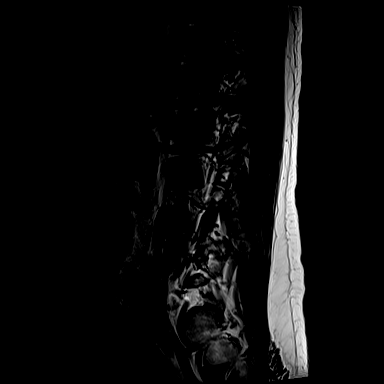
[im 9/15]
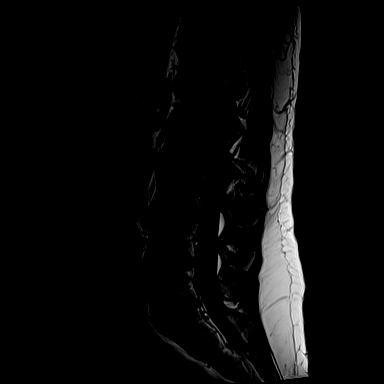
[im 15/15]
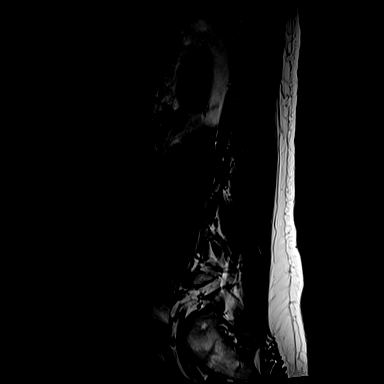

[Series 10: T1 · axial · 4.0mm · 0.28mm/px · z∈[-69,+82]mm · 3 of 41 slices shown (2 of 2)]
[im 6/41]
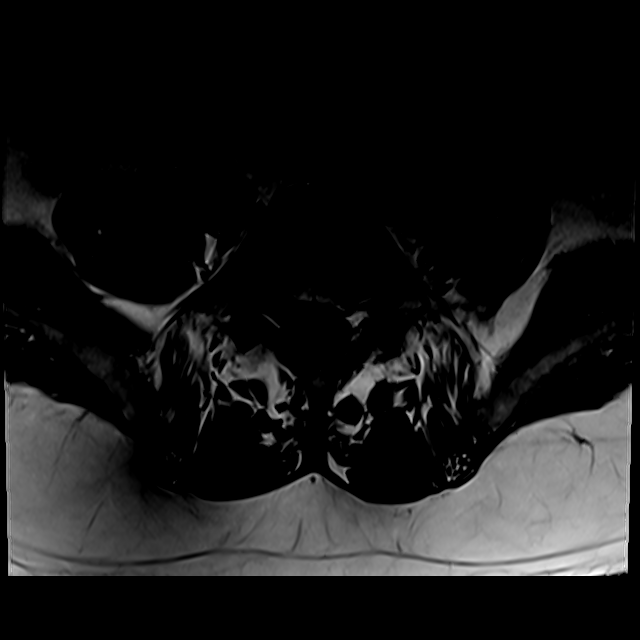
[im 21/41]
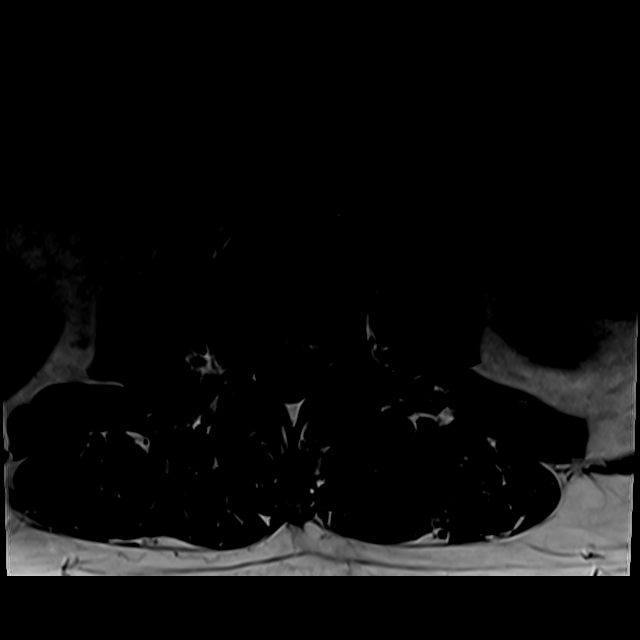
[im 35/41]
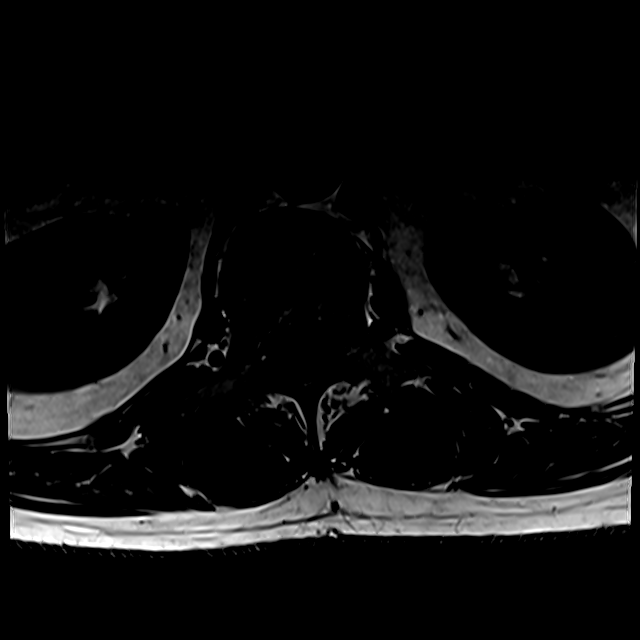

[Series 13: T2 · axial · 4.0mm · 0.28mm/px · z∈[-94,+82]mm · 6 of 41 slices shown (2 of 2)]
[im 1/41]
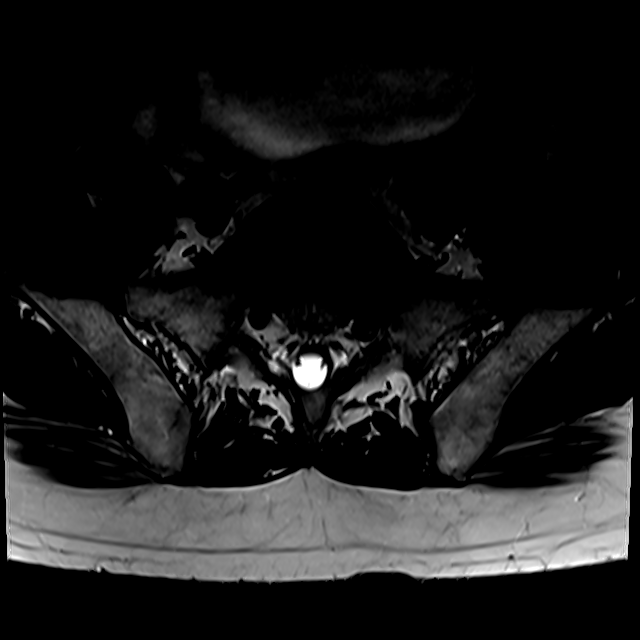
[im 6/41]
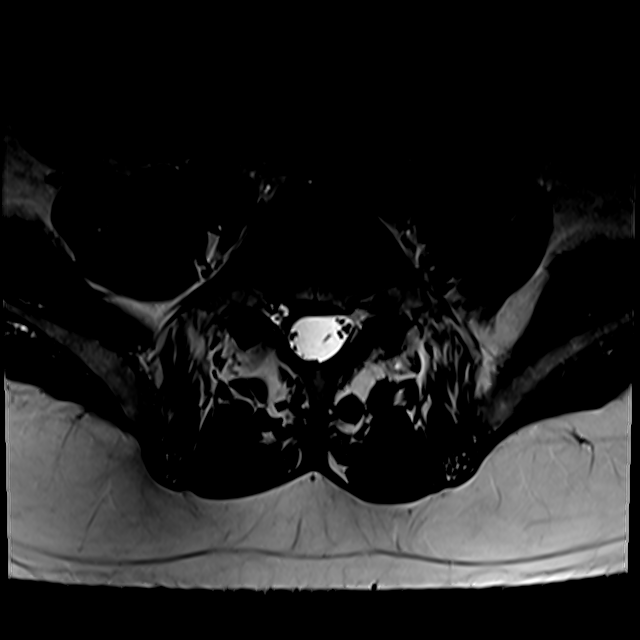
[im 12/41]
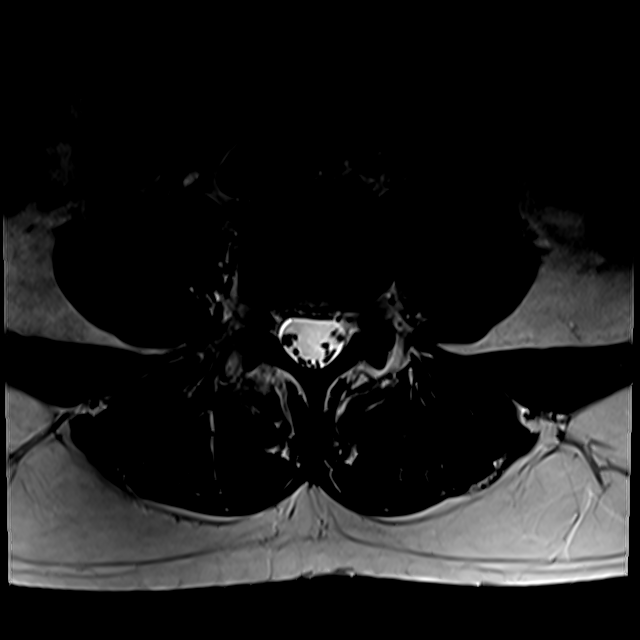
[im 18/41]
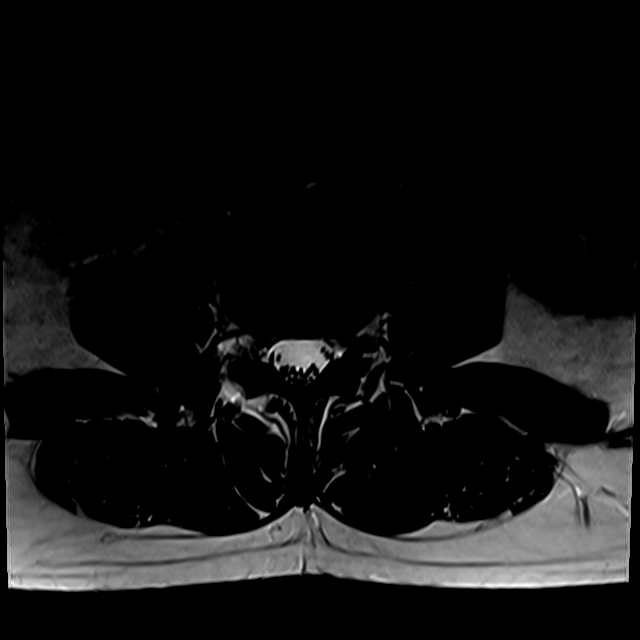
[im 21/41]
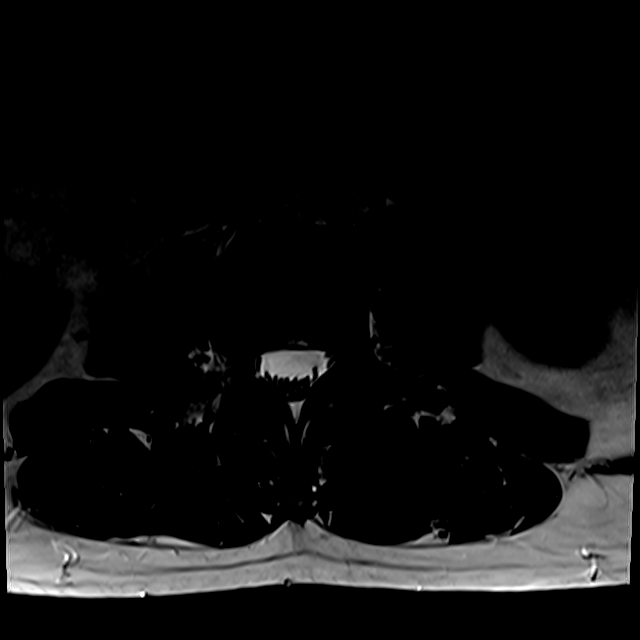
[im 35/41]
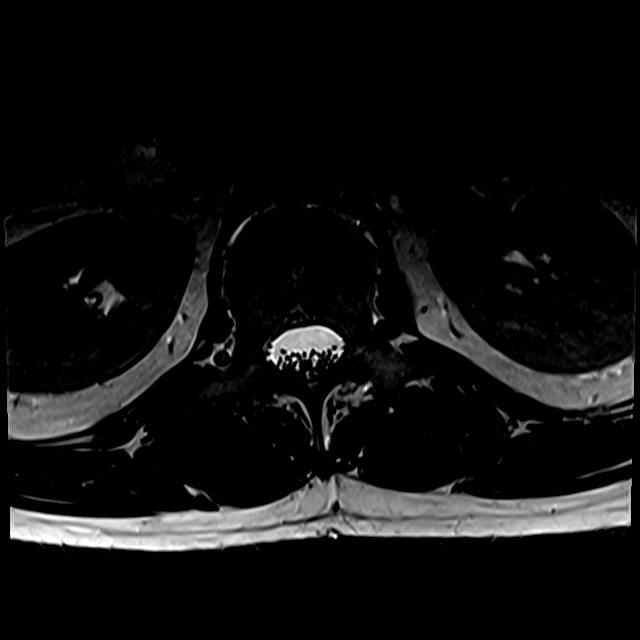

[18 of 48 positions shown; findings below may reference images not displayed]

FINDINGS: Segmentation:  Standard.

Alignment: Straightening of lordosis. Congenital spinal canal
narrowing.

Vertebrae: Normal bone marrow signal intensity. No fracture or
aggressive osseous lesion.

Conus medullaris and cauda equina: Conus extends to the L1 level.
Conus and cauda equina appear normal.

Disc levels: Mild multilevel desiccation.

L1-2: No significant disc bulge. Patent spinal canal and neural
foramen.

L2-3: Minimal disc bulge.  Patent spinal canal and neural foramen.

L3-4: Minimal disc bulge with superimposed left foraminal
protrusion. Facet degenerative spurring. Patent spinal canal and
right neural foramen. Minimal to mild left neural foraminal
narrowing.

L4-5: Disc bulge with superimposed bilateral foraminal protrusions.
Abutment of the descending left L5 nerve root. Bilateral facet
degenerative spurring. Patent spinal canal. Mild bilateral neural
foraminal narrowing.

L5-S1: Minimal disc bulge and bilateral facet degenerative spurring.
Patent spinal canal and neural foramen.

Paraspinal and other soft tissues: Negative.
IMPRESSION: Disc bulge with superimposed bilateral foraminal protrusions at the
L4-5 level. There is abutment of the descending left L5 nerve root
with mild bilateral neural foraminal narrowing at this level.

Left L3-4 foraminal protrusion with mild left neural foraminal
narrowing.

Congenital spinal canal narrowing with mild superimposed
spondylosis.

## 2024-01-17 ENCOUNTER — Other Ambulatory Visit (INDEPENDENT_AMBULATORY_CARE_PROVIDER_SITE_OTHER): Payer: Self-pay

## 2024-01-17 ENCOUNTER — Encounter: Payer: Self-pay | Admitting: Orthopedic Surgery

## 2024-01-17 ENCOUNTER — Ambulatory Visit: Admitting: Orthopedic Surgery

## 2024-01-17 DIAGNOSIS — M25571 Pain in right ankle and joints of right foot: Secondary | ICD-10-CM | POA: Diagnosis not present

## 2024-01-17 MED ORDER — LIDOCAINE HCL 1 % IJ SOLN
2.0000 mL | INTRAMUSCULAR | Status: AC | PRN
Start: 2024-01-17 — End: 2024-01-17
  Administered 2024-01-17: 2 mL

## 2024-01-17 MED ORDER — METHYLPREDNISOLONE ACETATE 40 MG/ML IJ SUSP
40.0000 mg | INTRAMUSCULAR | Status: AC | PRN
  Administered 2024-01-17: 40 mg via INTRA_ARTICULAR

## 2024-01-17 NOTE — Progress Notes (Signed)
 Office Visit Note   Patient: Elijah Newton           Date of Birth: 1971/02/23           MRN: [id number] Visit Date: 01/17/2024              Requested by: Carolan Clines, PA [address] [place],  [place] [address] PCP: Carolan Clines, [place]  Chief Complaint  Patient presents with   Right Ankle - Pain      HPI: Patient is a 53 year old gentleman who presents with anterior right ankle pain.  Patient states the pain is worse with dorsiflexion of the ankle.  He denies any injury denies any increased athletic activities.  Patient is status post right ankle arthroscopy for impingement symptoms in 2017.  Patient has previously had inflammation of the posterior tibial tendon.  Assessment & Plan: Visit Diagnoses:  1. Acute right ankle pain     Plan: Right ankle was injected he tolerated this well uric acid level was drawn.  Follow-up in 4 weeks.  Follow-Up Instructions: Return in about 4 weeks (around 02/14/2024).   Ortho Exam  Patient is alert, oriented, no adenopathy, well-dressed, normal affect, normal respiratory effort. Examination patient has no pain to palpation over the peroneal tendons or posterior tibial tendon.  Anterior tibial tendon is not tender to palpation the Achilles tendon is not tender to palpation.  He is point tender to palpation over the anterior ankle joint.  There is no redness or cellulitis.  Imaging: XR Ankle Complete Right Result Date: 01/17/2024 Three-view radiographs of the right ankle shows a congruent joint without subcondylar cysts or sclerosis.  No images are attached to the encounter.  Labs: No results found for: "HGBA1C", "ESRSEDRATE", "CRP", "LABURIC", "REPTSTATUS", "GRAMSTAIN", "CULT", "LABORGA"   No results found for: "ALBUMIN", "PREALBUMIN", "CBC"  No results found for: "MG" No results found for: "VD25OH"  No results found for: "PREALBUMIN"     No data to display           There is no height or weight on file to  calculate BMI.  Orders:  Orders Placed This Encounter  Procedures   XR Ankle Complete Right   Uric acid   No orders of the defined types were placed in this encounter.    Procedures: Medium Joint Inj: R ankle on 01/17/2024 5:04 PM Indications: pain and diagnostic evaluation Details: 22 G 1.5 in needle, anteromedial approach Medications: 2 mL lidocaine 1 %; 40 mg methylPREDNISolone acetate 40 MG/ML Outcome: tolerated well, no immediate complications Procedure, treatment alternatives, risks and benefits explained, specific risks discussed. Consent was given by the patient. Immediately prior to procedure a time out was called to verify the correct patient, procedure, equipment, support staff and site/side marked as required. Patient was prepped and draped in the usual sterile fashion.      Clinical Data: No additional findings.  ROS:  All other systems negative, except as noted in the HPI. Review of Systems  Objective: Vital Signs: There were no vitals taken for this visit.  Specialty Comments:  No specialty comments available.  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 No pertinent past medical history.  History reviewed. No pertinent family history.  History reviewed. No pertinent surgical history. Social History   Occupational History  Not on file  Tobacco Use   Smoking status: Never   Smokeless tobacco: Never  Substance and Sexual Activity   Alcohol use: No   Drug use: No   Sexual activity: Not on file

## 2024-01-18 LAB — URIC ACID: Uric Acid, Serum: 5.7 mg/dL (ref 4.0–8.0)

## 2024-01-19 ENCOUNTER — Telehealth: Payer: Self-pay | Admitting: Orthopedic Surgery

## 2024-01-19 NOTE — Telephone Encounter (Signed)
-----   Message from Nadara Mustard sent at 01/18/2024  6:30 AM EDT ----- Call patient uric acid is good at 5.7 ----- Message ----- From: Janace Hoard Lab Results In Sent: 01/18/2024  12:33 AM EDT To: Nadara Mustard, MD

## 2024-01-19 NOTE — Telephone Encounter (Signed)
 Called pt, no answer and VM box is full. Will keep message open to try again.

## 2024-01-20 NOTE — Telephone Encounter (Signed)
 Called again, goes straight to VM prompt, but no VM has been set up.

## 2024-02-15 ENCOUNTER — Ambulatory Visit: Admitting: Orthopedic Surgery

## 2024-02-16 ENCOUNTER — Ambulatory Visit (INDEPENDENT_AMBULATORY_CARE_PROVIDER_SITE_OTHER): Admitting: Orthopedic Surgery

## 2024-02-16 DIAGNOSIS — M25571 Pain in right ankle and joints of right foot: Secondary | ICD-10-CM | POA: Diagnosis not present

## 2024-02-17 ENCOUNTER — Encounter: Payer: Self-pay | Admitting: Orthopedic Surgery

## 2024-02-17 DIAGNOSIS — M25571 Pain in right ankle and joints of right foot: Secondary | ICD-10-CM | POA: Diagnosis not present

## 2024-02-17 MED ORDER — METHYLPREDNISOLONE ACETATE 40 MG/ML IJ SUSP
40.0000 mg | INTRAMUSCULAR | Status: AC | PRN
Start: 2024-02-17 — End: 2024-02-17
  Administered 2024-02-17: 40 mg via INTRA_ARTICULAR

## 2024-02-17 MED ORDER — LIDOCAINE HCL 1 % IJ SOLN
2.0000 mL | INTRAMUSCULAR | Status: AC | PRN
  Administered 2024-02-17: 2 mL

## 2024-02-17 NOTE — Progress Notes (Signed)
 Office Visit Note   Patient: Elijah Newton           Date of Birth: [birth year]           MRN: [id number] Visit Date: 02/16/2024              Requested by: Lindwood Rhody, PA [address] [place],  [place] [address] PCP: Lindwood Rhody, [place]  Chief Complaint  Patient presents with   Right Ankle - Follow-up      HPI: Patient is a 53 year old gentleman who presents with persistent impingement symptoms right ankle.  Patient states the injection 4 weeks ago did help but not as much as he expected.  Patient is status post arthroscopic debridement in 2017.  Assessment & Plan: Visit Diagnoses:  1. Acute right ankle pain     Plan: Plan for repeat injection today.  With plan follow-up in 4 weeks for possible third injection.  No clinical signs of gout.  Follow-Up Instructions: Return in about 4 weeks (around 03/15/2024).   Ortho Exam  Patient is alert, oriented, no adenopathy, well-dressed, normal affect, normal respiratory effort. Examination patient's ankle is tender to palpation anteriorly.  There is no redness no effusion.  Patient has good range of motion of the ankle and subtalar joint.  The posterior tibial tendon and peroneal tendons are not tender to palpation.  Patient's uric acid is 5.7.  Patient is a good dorsalis pedis pulse.  Imaging: No results found. No images are attached to the encounter.  Labs: Lab Results  Component Value Date   LABURIC 5.7 01/17/2024     No results found for: "ALBUMIN", "PREALBUMIN", "CBC"  No results found for: "MG" No results found for: "VD25OH"  No results found for: "PREALBUMIN"     No data to display           There is no height or weight on file to calculate BMI.  Orders:  No orders of the defined types were placed in this encounter.  No orders of the defined types were placed in this encounter.    Procedures: Medium Joint Inj: R ankle on 02/17/2024 7:35 AM Indications: pain and diagnostic  evaluation Details: 22 G 1.5 in needle, anteromedial approach Medications: 2 mL lidocaine  1 %; 40 mg methylPREDNISolone  acetate 40 MG/ML Outcome: tolerated well, no immediate complications Procedure, treatment alternatives, risks and benefits explained, specific risks discussed. Consent was given by the patient. Immediately prior to procedure a time out was called to verify the correct patient, procedure, equipment, support staff and site/side marked as required. Patient was prepped and draped in the usual sterile fashion.      Clinical Data: No additional findings.  ROS:  All other systems negative, except as noted in the HPI. Review of Systems  Objective: Vital Signs: There were no vitals taken for this visit.  Specialty Comments:  No specialty comments available.  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 No pertinent past medical history.  History reviewed. No pertinent family history.  History reviewed. No pertinent surgical history. Social History   Occupational History   Not on file  Tobacco Use   Smoking status: Never   Smokeless tobacco: Never  Substance and Sexual Activity   Alcohol use: No   Drug use: No   Sexual activity: Not  on file

## 2024-03-16 ENCOUNTER — Ambulatory Visit: Admitting: Orthopedic Surgery

## 2024-04-04 ENCOUNTER — Ambulatory Visit: Admitting: Orthopedic Surgery

## 2024-04-04 ENCOUNTER — Encounter: Payer: Self-pay | Admitting: Orthopedic Surgery

## 2024-04-04 DIAGNOSIS — M25571 Pain in right ankle and joints of right foot: Secondary | ICD-10-CM

## 2024-04-04 MED ORDER — LIDOCAINE HCL 1 % IJ SOLN
1.0000 mL | INTRAMUSCULAR | Status: AC | PRN
  Administered 2024-04-04: 1 mL

## 2024-04-04 MED ORDER — METHYLPREDNISOLONE ACETATE 40 MG/ML IJ SUSP
40.0000 mg | INTRAMUSCULAR | Status: AC | PRN
  Administered 2024-04-04: 40 mg via INTRA_ARTICULAR

## 2024-04-04 NOTE — Progress Notes (Signed)
 Office Visit Note   Patient: Elijah Newton           Date of Birth: [birth year]           MRN: [id number] Visit Date: 04/04/2024              Requested by: Luiz Tillman HERO, PA [address] [place],  [place] [address] PCP: Luiz Tillman HERO, [place]  Chief Complaint  Patient presents with   Right Ankle - Follow-up, Pain      HPI: Patient is a 53 year old gentleman who presents in follow-up for right foot and ankle pain.  The last injection in the ankle did not provide him any relief.  Patient states his pain is primarily over the sinus Tarsi at this time.  Patient states he has pain with the sheets laying on his foot.  Patient's last uric acid was 5.7.  Patient states he did not eat shrimp today.  Assessment & Plan: Visit Diagnoses:  1. Arthralgia of foot, right     Plan: The subtalar joint was injected we will follow-up in 4 weeks.  Patient's increased symptoms in a separate joint seem more consistent with gout.  Plan to obtain a uric acid at follow-up.  Follow-Up Instructions: Return in about 4 weeks (around 05/02/2024).   Ortho Exam  Patient is alert, oriented, no adenopathy, well-dressed, normal affect, normal respiratory effort. Examination the ankle joint is not tender to palpation.  The ankle ligaments are not tender to palpation.  Patient is point tender to palpation over the sinus Tarsi which is very tender.  Patient has pain reproduced with inversion and eversion of the subtalar joint.  No pain with range of motion of the ankle.    Imaging: No results found. No images are attached to the encounter.  Labs: Lab Results  Component Value Date   LABURIC 5.7 01/17/2024     No results found for: ALBUMIN, PREALBUMIN, CBC  No results found for: MG No results found for: VD25OH  No results found for: PREALBUMIN     No data to display           There is no height or weight on file to calculate BMI.  Orders:  No orders of the defined  types were placed in this encounter.  No orders of the defined types were placed in this encounter.    Procedures: Small Joint Inj: R subtalar on 04/04/2024 5:02 PM Indications: pain and diagnostic evaluation Details: 22 G needle  Spinal Needle: No  Medications: 1 mL lidocaine  1 %; 40 mg methylPREDNISolone  acetate 40 MG/ML Outcome: tolerated well, no immediate complications Procedure, treatment alternatives, risks and benefits explained, specific risks discussed. Consent was given by the patient. Immediately prior to procedure a time out was called to verify the correct patient, procedure, equipment, support staff and site/side marked as required. Patient was prepped and draped in the usual sterile fashion.      Clinical Data: No additional findings.  ROS:  All other systems negative, except as noted in the HPI. Review of Systems  Objective: Vital Signs: There were no vitals taken for this visit.  Specialty Comments:  No specialty comments available.  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 No pertinent past medical history.  History reviewed. No pertinent family history.  History reviewed.  No pertinent surgical history. Social History   Occupational History   Not on file  Tobacco Use   Smoking status: Never   Smokeless tobacco: Never  Substance and Sexual Activity   Alcohol use: No   Drug use: No   Sexual activity: Not on file

## 2024-05-04 ENCOUNTER — Ambulatory Visit: Admitting: Orthopedic Surgery

## 2024-08-14 ENCOUNTER — Encounter: Payer: Self-pay | Admitting: Radiology

## 2024-09-26 ENCOUNTER — Ambulatory Visit: Admitting: Orthopedic Surgery

## 2024-09-26 ENCOUNTER — Other Ambulatory Visit

## 2024-09-26 DIAGNOSIS — M25571 Pain in right ankle and joints of right foot: Secondary | ICD-10-CM

## 2024-09-27 ENCOUNTER — Encounter: Payer: Self-pay | Admitting: Orthopedic Surgery

## 2024-09-27 DIAGNOSIS — M25571 Pain in right ankle and joints of right foot: Secondary | ICD-10-CM | POA: Diagnosis not present

## 2024-09-27 MED ORDER — LIDOCAINE HCL 1 % IJ SOLN
2.0000 mL | INTRAMUSCULAR | Status: AC | PRN
  Administered 2024-09-27: 13:00:00 2 mL

## 2024-09-27 MED ORDER — METHYLPREDNISOLONE ACETATE 40 MG/ML IJ SUSP
40.0000 mg | INTRAMUSCULAR | Status: AC | PRN
  Administered 2024-09-27: 13:00:00 40 mg via INTRA_ARTICULAR

## 2024-09-27 NOTE — Progress Notes (Signed)
 Office Visit Note   Patient: Elijah Newton           Date of Birth: 1971/08/14           MRN: [id number] Visit Date: 09/26/2024              Requested by: Luiz Tillman HERO, PA [address] [place],  [place] [address] PCP: Luiz Tillman HERO, [place]  Chief Complaint  Patient presents with   Right Ankle - Pain      HPI: Discussed the use of AI scribe software for clinical note transcription with the patient, who gave verbal consent to proceed.  History of Present Illness Elijah Newton is a 53 year old male who presents with right ankle pain.  He experiences pain in his right ankle, particularly when attempting to flex it. The pain occurs when he tries to walk, describing it as a sensation of something being out of place until it settles, allowing him to walk more easily. Once his bones 'take position,' he can start walking, albeit slowly at first, and then more comfortably over time. However, the pain returns when he sits back down.  He identifies a specific spot of pain and has difficulty walking due to this discomfort.     Assessment & Plan: Visit Diagnoses:  1. 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 Radiographs show mild medial joint line narrowing and spurring. 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 - Administered cortisone injection into the subtalar joint. - Recommended over-the-counter Voltaren gel, applied topically three times daily. - Discussed potential for subtalar fusion if conservative measures fail.      Follow-Up Instructions: Return if symptoms worsen or fail to improve.   Ortho Exam  Patient is alert, oriented, no adenopathy, well-dressed, normal affect, normal respiratory effort. Physical Exam CARDIOVASCULAR: Pulse normal. MUSCULOSKELETAL: Tenderness to palpation over the  sinus tarsi. Subtalar motion reproduces pain. NEUROLOGICAL: Extraocular movements intact.   No pain with range of motion of the ankle.   Imaging: XR Ankle Complete Right Result Date: 09/27/2024 2 view radiographs of the right ankle shows a congruent tibiotalar joint.  There is some irregularity from the calcaneus to the talus with possible fibrous bar.  The posterior facet is congruent  No images are attached to the encounter.  Labs: Lab Results  Component Value Date   LABURIC 5.7 01/17/2024     No results found for: ALBUMIN, PREALBUMIN, CBC  No results found for: MG No results found for: VD25OH  No results found for: PREALBUMIN     No data to display           There is no height or weight on file to calculate BMI.  Orders:  Orders Placed This Encounter  Procedures   XR Ankle Complete Right   No orders of the defined types were placed in this encounter.    Procedures: Medium Joint Inj on 09/27/2024 1:29 PM Indications: pain and diagnostic evaluation Details: 22 G 1.5 in needle, dorsal approach Medications: 2 mL lidocaine  1 %; 40 mg methylPREDNISolone  acetate 40 MG/ML Outcome: tolerated well, no immediate complications Procedure, treatment alternatives, risks and benefits explained, specific risks discussed. Consent was given by the patient. Immediately prior to procedure a time out was called to verify the correct patient, procedure, equipment, support staff and site/side marked as required. Patient was prepped  and draped in the usual sterile fashion.      Clinical Data: No additional findings.  ROS:  All other systems negative, except as noted in the HPI. Review of Systems  Objective: Vital Signs: There were no vitals taken for this visit.  Specialty Comments:  No specialty comments available.  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 No pertinent past medical history.  History reviewed. No pertinent family history.  History reviewed. No pertinent surgical history. Social History   Occupational History   Not on file  Tobacco Use   Smoking status: Never   Smokeless tobacco: Never  Substance and Sexual Activity   Alcohol use: No   Drug use: No   Sexual activity: Not on file
